# Patient Record
Sex: Male | Born: 2000 | Race: White | Hispanic: No | Marital: Single | Smoking: Never smoker
Health system: Southern US, Community
[De-identification: ages and names within clinical notes are randomized; demographics above are authoritative.]

## PROBLEM LIST (undated history)

## (undated) DIAGNOSIS — F909 Attention-deficit hyperactivity disorder, unspecified type: Secondary | ICD-10-CM

## (undated) DIAGNOSIS — Z87442 Personal history of urinary calculi: Secondary | ICD-10-CM

## (undated) HISTORY — PX: NO PAST SURGERIES: SHX2092

---
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
## 2020-06-24 IMAGING — CR DG THORACOLUMBAR SPINE 2V
2 series · 2 of 2 positions shown · non-contrast
Comparison: None.

CLINICAL DATA: Chronic back pain

EXAM:
THORACOLUMBAR SPINE 1V

[w thoraco-lumbar junction ap]
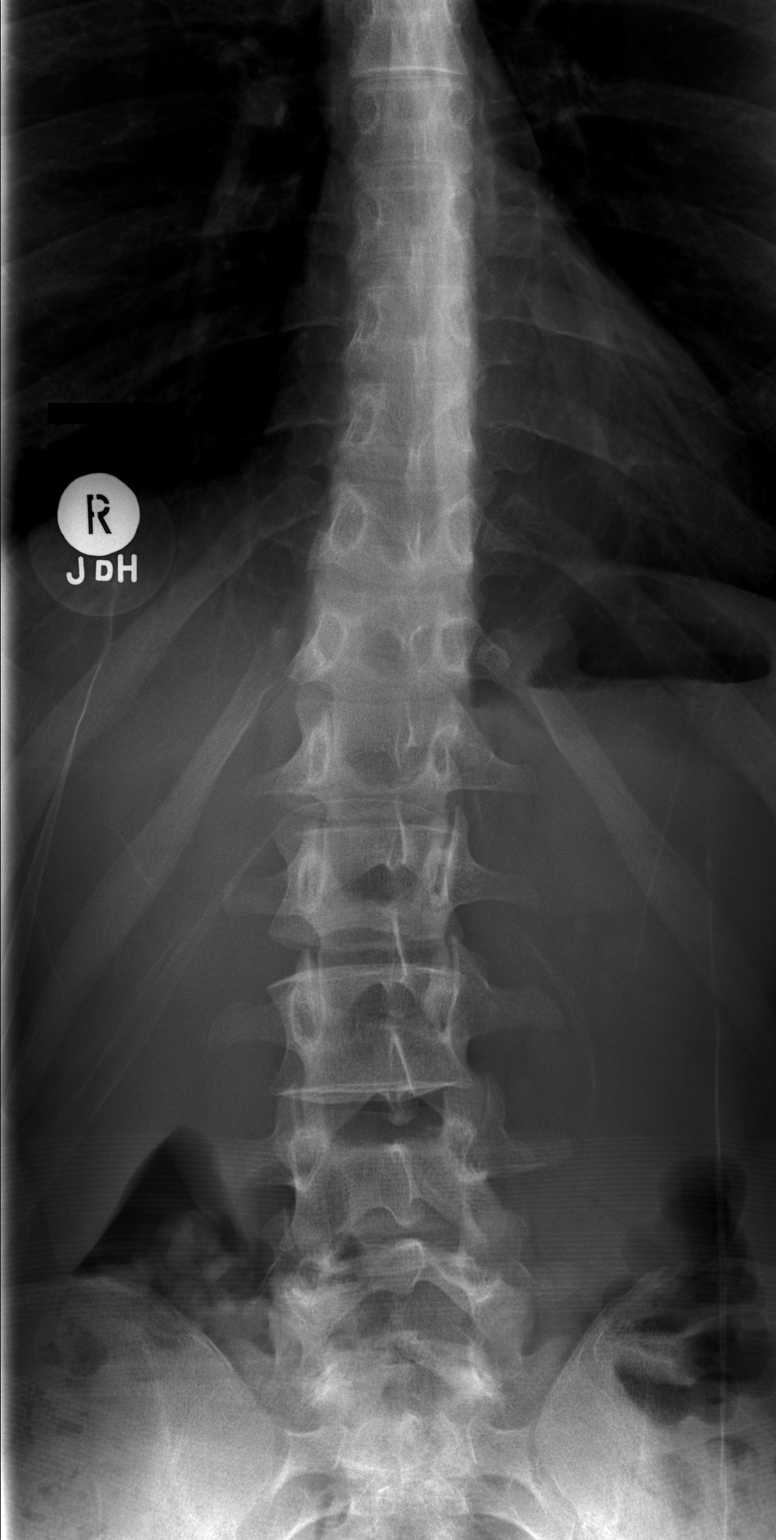

[w thoraco-lumbar junction lat]
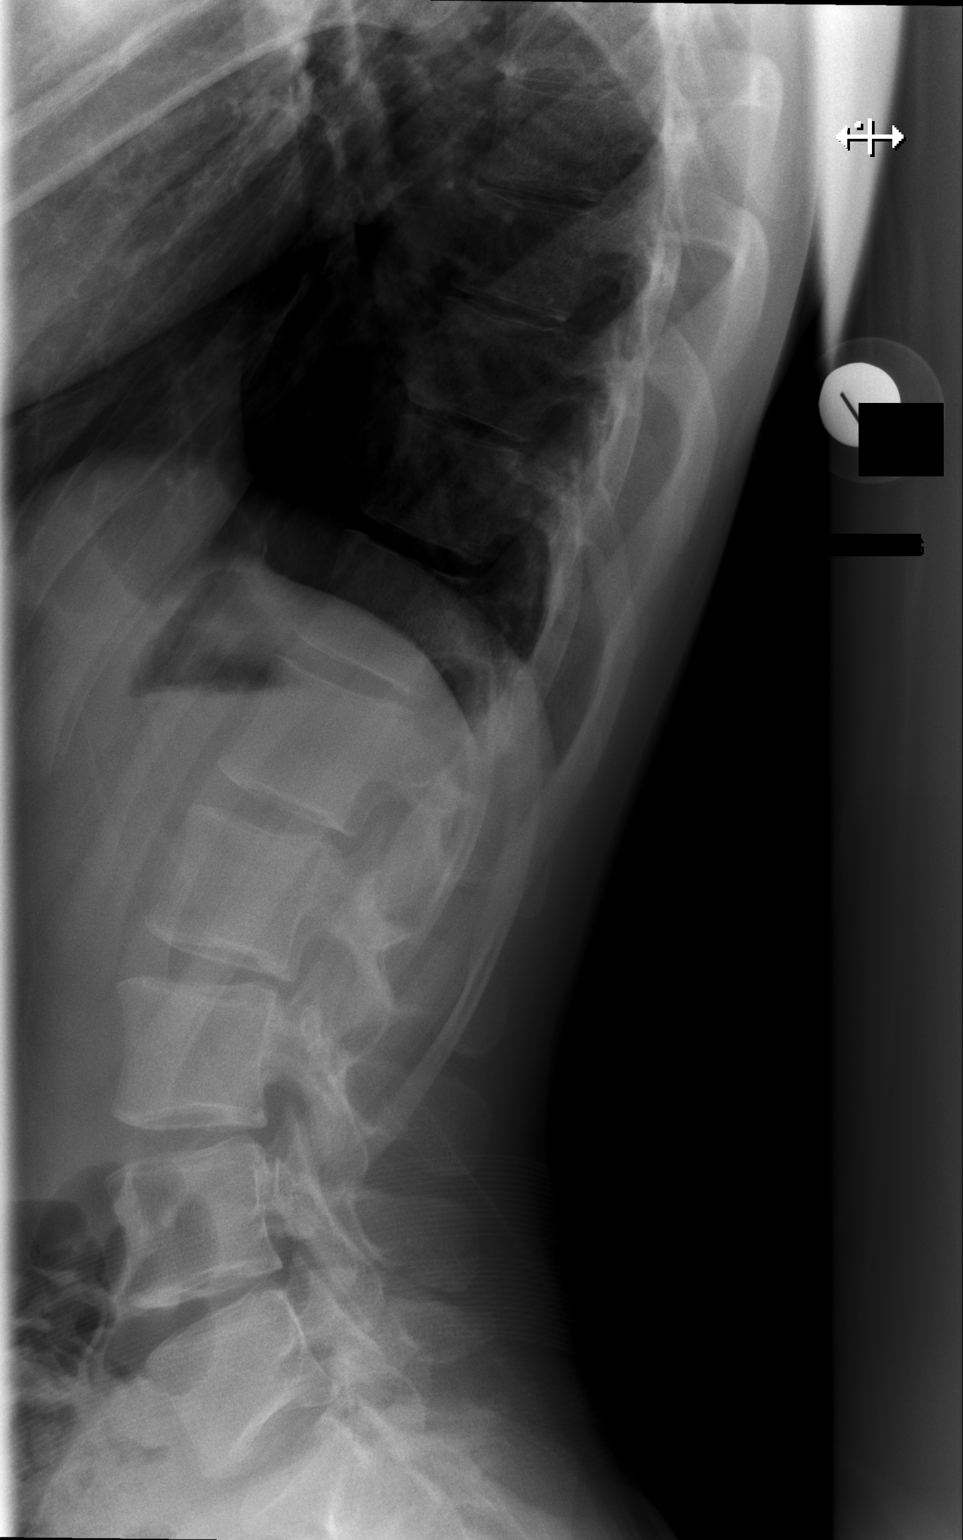

[2 of 2 positions shown; findings below may reference images not displayed]

FINDINGS: Minimal scoliosis, apex right. Vertebral body heights and disc
spaces are normal.
IMPRESSION: Minimal scoliosis.  No acute osseous abnormality

## 2020-07-23 DIAGNOSIS — J069 Acute upper respiratory infection, unspecified: Secondary | ICD-10-CM | POA: Diagnosis not present

## 2020-12-05 DIAGNOSIS — Z20822 Contact with and (suspected) exposure to covid-19: Secondary | ICD-10-CM | POA: Diagnosis not present

## 2021-03-14 DIAGNOSIS — M9901 Segmental and somatic dysfunction of cervical region: Secondary | ICD-10-CM | POA: Diagnosis not present

## 2021-03-14 DIAGNOSIS — M4155 Other secondary scoliosis, thoracolumbar region: Secondary | ICD-10-CM | POA: Diagnosis not present

## 2021-03-14 DIAGNOSIS — M9903 Segmental and somatic dysfunction of lumbar region: Secondary | ICD-10-CM | POA: Diagnosis not present

## 2021-03-14 DIAGNOSIS — M9905 Segmental and somatic dysfunction of pelvic region: Secondary | ICD-10-CM | POA: Diagnosis not present

## 2021-03-18 DIAGNOSIS — M9901 Segmental and somatic dysfunction of cervical region: Secondary | ICD-10-CM | POA: Diagnosis not present

## 2021-03-18 DIAGNOSIS — M4155 Other secondary scoliosis, thoracolumbar region: Secondary | ICD-10-CM | POA: Diagnosis not present

## 2021-03-18 DIAGNOSIS — M9905 Segmental and somatic dysfunction of pelvic region: Secondary | ICD-10-CM | POA: Diagnosis not present

## 2021-03-18 DIAGNOSIS — M9903 Segmental and somatic dysfunction of lumbar region: Secondary | ICD-10-CM | POA: Diagnosis not present

## 2022-04-02 DIAGNOSIS — U071 COVID-19: Secondary | ICD-10-CM | POA: Diagnosis not present

## 2023-04-23 DIAGNOSIS — M222X2 Patellofemoral disorders, left knee: Secondary | ICD-10-CM | POA: Diagnosis not present

## 2023-05-06 DIAGNOSIS — J069 Acute upper respiratory infection, unspecified: Secondary | ICD-10-CM | POA: Diagnosis not present

## 2023-05-06 DIAGNOSIS — R051 Acute cough: Secondary | ICD-10-CM | POA: Diagnosis not present

## 2023-05-06 DIAGNOSIS — Z03818 Encounter for observation for suspected exposure to other biological agents ruled out: Secondary | ICD-10-CM | POA: Diagnosis not present

## 2023-05-20 ENCOUNTER — Emergency Department (HOSPITAL_BASED_OUTPATIENT_CLINIC_OR_DEPARTMENT_OTHER): Payer: BC Managed Care – PPO

## 2023-05-20 ENCOUNTER — Encounter (HOSPITAL_BASED_OUTPATIENT_CLINIC_OR_DEPARTMENT_OTHER): Payer: Self-pay

## 2023-05-20 ENCOUNTER — Emergency Department (HOSPITAL_BASED_OUTPATIENT_CLINIC_OR_DEPARTMENT_OTHER)
Admission: EM | Admit: 2023-05-20 | Discharge: 2023-05-20 | Disposition: A | Discharge status: Home / Self Care | Payer: BC Managed Care – PPO | Attending: Emergency Medicine | Admitting: Emergency Medicine

## 2023-05-20 DIAGNOSIS — R109 Unspecified abdominal pain: Secondary | ICD-10-CM | POA: Diagnosis not present

## 2023-05-20 DIAGNOSIS — N200 Calculus of kidney: Secondary | ICD-10-CM | POA: Diagnosis not present

## 2023-05-20 DIAGNOSIS — N202 Calculus of kidney with calculus of ureter: Secondary | ICD-10-CM | POA: Diagnosis not present

## 2023-05-20 DIAGNOSIS — K429 Umbilical hernia without obstruction or gangrene: Secondary | ICD-10-CM | POA: Diagnosis not present

## 2023-05-20 DIAGNOSIS — K529 Noninfective gastroenteritis and colitis, unspecified: Secondary | ICD-10-CM | POA: Insufficient documentation

## 2023-05-20 LAB — URINALYSIS, ROUTINE W REFLEX MICROSCOPIC
Bilirubin Urine: NEGATIVE
Glucose, UA: NEGATIVE mg/dL
Leukocytes,Ua: NEGATIVE
Nitrite: NEGATIVE
Protein, ur: 30 mg/dL — AB
RBC / HPF: >50 RBC/hpf (ref 0–5)
Specific Gravity, Urine: 1.022 (ref 1.005–1.030)
pH: 5.5 (ref 5.0–8.0)

## 2023-05-20 LAB — BASIC METABOLIC PANEL
Anion gap: 7 (ref 5–15)
BUN: 7 mg/dL (ref 6–20)
CO2: 27 mmol/L (ref 22–32)
Calcium: 9.2 mg/dL (ref 8.9–10.3)
Chloride: 107 mmol/L (ref 98–111)
Creatinine, Ser: 0.85 mg/dL (ref 0.61–1.24)
GFR, Estimated: >60 mL/min (ref >60)
Glucose, Bld: 94 mg/dL (ref 70–99)
Potassium: 3.8 mmol/L (ref 3.5–5.1)
Sodium: 141 mmol/L (ref 135–145)

## 2023-05-20 MED ORDER — TAMSULOSIN HCL 0.4 MG PO CAPS
0.4000 mg | ORAL_CAPSULE | ORAL | Status: AC
  Administered 2023-05-20: 0.4 mg via ORAL
  Filled 2023-05-20: qty 1

## 2023-05-20 MED ORDER — AMOXICILLIN-POT CLAVULANATE 875-125 MG PO TABS
1.0000 | ORAL_TABLET | Freq: Once | ORAL | Status: AC
  Administered 2023-05-20: 1 via ORAL
  Filled 2023-05-20: qty 1

## 2023-05-20 MED ORDER — DICLOFENAC SODIUM ER 100 MG PO TB24: 100.0000 mg | ORAL_TABLET | Freq: Every day | ORAL | 0 refills | Status: DC

## 2023-05-20 MED ORDER — ONDANSETRON HCL 8 MG PO TABS: 8.0000 mg | ORAL_TABLET | Freq: Three times a day (TID) | ORAL | 0 refills | Status: AC | PRN

## 2023-05-20 MED ORDER — ONDANSETRON HCL 4 MG/2ML IJ SOLN
4.0000 mg | Freq: Once | INTRAMUSCULAR | Status: AC
  Administered 2023-05-20: 4 mg via INTRAVENOUS
  Filled 2023-05-20: qty 2

## 2023-05-20 MED ORDER — TAMSULOSIN HCL 0.4 MG PO CAPS: 0.4000 mg | ORAL_CAPSULE | Freq: Every day | ORAL | 0 refills | Status: AC

## 2023-05-20 MED ORDER — AMOXICILLIN-POT CLAVULANATE 875-125 MG PO TABS: 1.0000 | ORAL_TABLET | Freq: Two times a day (BID) | ORAL | 0 refills | Status: AC

## 2023-05-20 MED ORDER — MAGNESIUM SULFATE 2 GM/50ML IV SOLN
2.0000 g | Freq: Once | INTRAVENOUS | Status: AC
  Administered 2023-05-20: 2 g via INTRAVENOUS
  Filled 2023-05-20: qty 50

## 2023-05-20 MED ORDER — KETOROLAC TROMETHAMINE 30 MG/ML IJ SOLN
30.0000 mg | Freq: Once | INTRAMUSCULAR | Status: AC
  Administered 2023-05-20: 30 mg via INTRAVENOUS
  Filled 2023-05-20: qty 1

## 2023-05-20 MED ORDER — ONDANSETRON HCL 8 MG PO TABS: 8.0000 mg | ORAL_TABLET | Freq: Three times a day (TID) | ORAL | 0 refills | Status: DC | PRN

## 2023-05-20 MED ORDER — HYDROCODONE-ACETAMINOPHEN 5-325 MG PO TABS: 1.0000 | ORAL_TABLET | Freq: Four times a day (QID) | ORAL | 0 refills | Status: AC | PRN

## 2023-05-20 NOTE — ED Provider Notes (Signed)
[place] EMERGENCY DEPARTMENT AT [facility] Provider Note   CSN: [id number] Arrival date & time: 05/20/23  0342     History  Chief Complaint  Patient presents with   Flank Pain    Right side    Keith Park is a 22 y.o. male.  The history is provided by the patient and a parent.  Flank Pain This is a new problem. The current episode started 3 to 5 hours ago. The problem occurs constantly. The problem has not changed since onset.Pertinent negatives include no chest pain, no headaches and no shortness of breath. Nothing aggravates the symptoms. Nothing relieves the symptoms. The treatment provided no relief.  Patient with R flank pain and bloody urine as well as nausea.     History reviewed. No pertinent past medical history.   Home Medications Prior to Admission medications   Medication Sig Start Date End Date Taking? Authorizing Provider  amoxicillin-clavulanate (AUGMENTIN) 875-125 MG tablet Take 1 tablet by mouth every 12 (twelve) hours. 05/20/23  Yes Rondarius Kadrmas, MD  Diclofenac Sodium CR 100 MG 24 hr tablet Take 1 tablet (100 mg total) by mouth daily. 05/20/23  Yes Lahna Nath, MD  tamsulosin (FLOMAX) 0.4 MG CAPS capsule Take 1 capsule (0.4 mg total) by mouth daily. 05/20/23  Yes Merlon Alcorta, MD  ondansetron (ZOFRAN) 8 MG tablet Take 1 tablet (8 mg total) by mouth every 8 (eight) hours as needed for nausea. 05/20/23   Britiany Silbernagel, MD      Allergies    Patient has no known allergies.    Review of Systems   Review of Systems  Constitutional:  Negative for fever.  Respiratory:  Negative for shortness of breath.   Cardiovascular:  Negative for chest pain.  Gastrointestinal:  Positive for nausea.  Genitourinary:  Positive for flank pain and hematuria.  Neurological:  Negative for headaches.  All other systems reviewed and are negative.   Physical Exam Updated Vital Signs BP 128/87   Pulse 60   Ht 5\' 10"  (1.778 m)   Wt 59 kg   SpO2 100%   BMI  18.65 kg/m  Physical Exam Vitals and nursing note reviewed. Exam conducted with a chaperone present.  Constitutional:      General: He is not in acute distress.    Appearance: Normal appearance. He is well-developed. He is not diaphoretic.  HENT:     Head: Normocephalic and atraumatic.     Nose: Nose normal.  Eyes:     Conjunctiva/sclera: Conjunctivae normal.     Pupils: Pupils are equal, round, and reactive to light.  Cardiovascular:     Rate and Rhythm: Normal rate and regular rhythm.     Pulses: Normal pulses.     Heart sounds: Normal heart sounds.  Pulmonary:     Effort: Pulmonary effort is normal.     Breath sounds: Normal breath sounds. No wheezing or rales.  Abdominal:     General: Bowel sounds are normal.     Palpations: Abdomen is soft.     Tenderness: There is no abdominal tenderness. There is no guarding or rebound.  Musculoskeletal:        General: Normal range of motion.     Cervical back: Normal range of motion and neck supple.  Skin:    General: Skin is warm and dry.     Capillary Refill: Capillary refill takes less than 2 seconds.  Neurological:     General: No focal deficit present.  Mental Status: He is alert and oriented to person, place, and time.     Deep Tendon Reflexes: Reflexes normal.  Psychiatric:        Mood and Affect: Mood normal.        Behavior: Behavior normal.     ED Results / Procedures / Treatments   Labs (all labs ordered are listed, but only abnormal results are displayed) Results for orders placed or performed during the hospital encounter of 05/20/23  Urinalysis, Routine w reflex microscopic -Urine, Clean Catch  Result Value Ref Range   Color, Urine YELLOW YELLOW   APPearance CLOUDY (A) CLEAR   Specific Gravity, Urine 1.022 1.005 - 1.030   pH 5.5 5.0 - 8.0   Glucose, UA NEGATIVE NEGATIVE mg/dL   Hgb urine dipstick LARGE (A) NEGATIVE   Bilirubin Urine NEGATIVE NEGATIVE   Ketones, ur TRACE (A) NEGATIVE mg/dL   Protein, ur  30 (A) NEGATIVE mg/dL   Nitrite NEGATIVE NEGATIVE   Leukocytes,Ua NEGATIVE NEGATIVE   RBC / HPF >50 0 - 5 RBC/hpf   WBC, UA 21-50 0 - 5 WBC/hpf   Bacteria, UA FEW (A) NONE SEEN   Squamous Epithelial / HPF 0-5 0 - 5 /HPF   Mucus PRESENT   Basic metabolic panel  Result Value Ref Range   Sodium 141 135 - 145 mmol/L   Potassium 3.8 3.5 - 5.1 mmol/L   Chloride 107 98 - 111 mmol/L   CO2 27 22 - 32 mmol/L   Glucose, Bld 94 70 - 99 mg/dL   BUN 7 6 - 20 mg/dL   Creatinine, Ser 0.98 0.61 - 1.24 mg/dL   Calcium 9.2 8.9 - 11.9 mg/dL   GFR, Estimated >14 >78 mL/min   Anion gap 7 5 - 15   CT Renal Stone Study  Result Date: 05/20/2023 CLINICAL DATA:  Right flank pain with blood in the urine and painful urination. EXAM: CT ABDOMEN AND PELVIS WITHOUT CONTRAST TECHNIQUE: Multidetector CT imaging of the abdomen and pelvis was performed following the standard protocol without IV contrast. RADIATION DOSE REDUCTION: This exam was performed according to the departmental dose-optimization program which includes automated exposure control, adjustment of the mA and/or kV according to patient size and/or use of iterative reconstruction technique. COMPARISON:  None Available. FINDINGS: Lower chest: There are scattered linear scar-like opacities in the left-greater-than-right lower lobes. Lung bases are clear of infiltrates. The cardiac size is normal. Hepatobiliary: The liver is 18 cm length demonstrating normal noncontrast attenuation and is homogeneous without a visible focal mass. Evaluation for mass enhancement is less accurate without contrast. Gallbladder and bile ducts are unremarkable. Pancreas: Unremarkable without contrast. Spleen: Unremarkable without contrast.  No splenomegaly. Adrenals/Urinary Tract: There is no adrenal mass. There is no contour deforming abnormality of either kidney. The unenhanced renal cortex is homogeneous. Both kidneys demonstrate faint medullary nephrocalcinosis. There are occasional  punctate nonobstructive caliceal stones in both kidneys. There is no hydronephrosis bilaterally. On the right there is a 3 mm distal ureteral stone 2 cm-2.5 cm proximal to the UVJ, just above the level of the ischial spines. Both ureters are otherwise clear. The bladder is unremarkable for the degree of distention. Stomach/Bowel: No dilatation or wall thickening of the stomach and small bowel. The appendix is located along the right pelvic side wall in between the internal and external iliac vasculature, and is unremarkable. There is mild wall thickening versus nondistention in the ascending and proximal transverse colon. Underlying colitis is not strictly excluded. Rest of  the colon wall is unremarkable. Vascular/Lymphatic: No significant vascular findings are present. No enlarged abdominal or pelvic lymph nodes. Reproductive: Prostate is unremarkable and both testicles are in the scrotal sac. Other: Small umbilical fat hernia. There are no incarcerated hernias. There is no free hemorrhage, free air, or free fluid. Musculoskeletal: No acute or significant osseous findings. IMPRESSION: 1. 3 mm distal right ureteral stone 2 cm-2.5 cm proximal to the UVJ, without hydronephrosis. 2. 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 3. Ascending and proximal transverse colitis versus nondistention. 4. Small umbilical fat hernia. Electronically Signed   By: Almira Bar M.D.   On: 05/20/2023 05:12     Radiology CT Renal Stone Study  Result Date: 05/20/2023 CLINICAL DATA:  Right flank pain with blood in the urine and painful urination. EXAM: CT ABDOMEN AND PELVIS WITHOUT CONTRAST TECHNIQUE: Multidetector CT imaging of the abdomen and pelvis was performed following the standard protocol without IV contrast. RADIATION DOSE REDUCTION: This exam was performed according to the departmental dose-optimization program which includes automated exposure control,  adjustment of the mA and/or kV according to patient size and/or use of iterative reconstruction technique. COMPARISON:  None Available. FINDINGS: Lower chest: There are scattered linear scar-like opacities in the left-greater-than-right lower lobes. Lung bases are clear of infiltrates. The cardiac size is normal. Hepatobiliary: The liver is 18 cm length demonstrating normal noncontrast attenuation and is homogeneous without a visible focal mass. Evaluation for mass enhancement is less accurate without contrast. Gallbladder and bile ducts are unremarkable. Pancreas: Unremarkable without contrast. Spleen: Unremarkable without contrast.  No splenomegaly. Adrenals/Urinary Tract: There is no adrenal mass. There is no contour deforming abnormality of either kidney. The unenhanced renal cortex is homogeneous. Both kidneys demonstrate faint medullary nephrocalcinosis. There are occasional punctate nonobstructive caliceal stones in both kidneys. There is no hydronephrosis bilaterally. On the right there is a 3 mm distal ureteral stone 2 cm-2.5 cm proximal to the UVJ, just above the level of the ischial spines. Both ureters are otherwise clear. The bladder is unremarkable for the degree of distention. Stomach/Bowel: No dilatation or wall thickening of the stomach and small bowel. The appendix is located along the right pelvic side wall in between the internal and external iliac vasculature, and is unremarkable. There is mild wall thickening versus nondistention in the ascending and proximal transverse colon. Underlying colitis is not strictly excluded. Rest of the colon wall is unremarkable. Vascular/Lymphatic: No significant vascular findings are present. No enlarged abdominal or pelvic lymph nodes. Reproductive: Prostate is unremarkable and both testicles are in the scrotal sac. Other: Small umbilical fat hernia. There are no incarcerated hernias. There is no free hemorrhage, free air, or free fluid. Musculoskeletal: No  acute or significant osseous findings. IMPRESSION: 1. 3 mm distal right ureteral stone 2 cm-2.5 cm proximal to the UVJ, without hydronephrosis. 2. 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 3. Ascending and proximal transverse colitis versus nondistention. 4. Small umbilical fat hernia. Electronically Signed   By: Almira Bar M.D.   On: 05/20/2023 05:12    Procedures Procedures    Medications Ordered in ED Medications  ketorolac (TORADOL) 30 MG/ML injection 30 mg (30 mg Intravenous Given 05/20/23 0454)  magnesium sulfate IVPB 2 g 50 mL (0 g Intravenous Stopped 05/20/23 0529)  tamsulosin (FLOMAX) capsule 0.4 mg (0.4 mg Oral Given 05/20/23 0528)  ondansetron (ZOFRAN) injection 4 mg (4 mg Intravenous Given 05/20/23 0540)  amoxicillin-clavulanate (AUGMENTIN) 875-125 MG per tablet 1 tablet (  1 tablet Oral Given 05/20/23 0540)    ED Course/ Medical Decision Making/ A&P                             Medical Decision Making Patient with right flank pain and nausea   Amount and/or Complexity of Data Reviewed Independent Historian: parent    Details: See above  External Data Reviewed: notes.    Details: Previous notes reviewed  Labs: ordered.    Details: Urine consistent with hematuria.  Sodium normal 141, normal potassium 3.8, normal creatinine  Radiology: ordered and independent interpretation performed.    Details: Distal R kidney stone by me  Risk Prescription drug management. Risk Details: Kidney stone and colitis.  Strainer provided. Work note provided.  Instructed cannot drive while taking narcotics.  Will refer to urology for kidney stone and GI for follow up of colitis as patient will need colonoscopy.  Will start augmentin.  PO challenged successfully.   Stable for discharge.  Strict return.  All questions answered to patient's and family satisfaction    Final Clinical Impression(s) / ED Diagnoses Final diagnoses:   Colitis  Kidney stone   Return for intractable cough, coughing up blood, fevers > 100.4 unrelieved by medication, shortness of breath, intractable vomiting, chest pain, shortness of breath, weakness, numbness, changes in speech, facial asymmetry, abdominal pain, passing out, Inability to tolerate liquids or food, cough, altered mental status or any concerns. No signs of systemic illness or infection. The patient is nontoxic-appearing on exam and vital signs are within normal limits.  I have reviewed the triage vital signs and the nursing notes. Pertinent labs & imaging results that were available during my care of the patient were reviewed by me and considered in my medical decision making (see chart for details). After history, exam, and medical workup I feel the patient has been appropriately medically screened and is safe for discharge home. Pertinent diagnoses were discussed with the patient. Patient was given return precautions.  Rx / DC Orders ED Discharge Orders          Ordered    amoxicillin-clavulanate (AUGMENTIN) 875-125 MG tablet  Every 12 hours        05/20/23 0534    ondansetron (ZOFRAN) 8 MG tablet  Every 8 hours PRN,   Status:  Discontinued        05/20/23 0534    tamsulosin (FLOMAX) 0.4 MG CAPS capsule  Daily        05/20/23 0534    Diclofenac Sodium CR 100 MG 24 hr tablet  Daily        05/20/23 0534    ondansetron (ZOFRAN) 8 MG tablet  Every 8 hours PRN        05/20/23 0535              Kendrah Lovern, MD 05/20/23 3329

## 2023-05-20 NOTE — ED Triage Notes (Signed)
Patient states he woke up an hour ago with right side flank pain. Patient states there is blood in his urine, pain with urination.

## 2023-05-24 ENCOUNTER — Other Ambulatory Visit: Payer: Self-pay

## 2023-05-24 ENCOUNTER — Emergency Department (HOSPITAL_BASED_OUTPATIENT_CLINIC_OR_DEPARTMENT_OTHER): Payer: BC Managed Care – PPO

## 2023-05-24 ENCOUNTER — Emergency Department (HOSPITAL_BASED_OUTPATIENT_CLINIC_OR_DEPARTMENT_OTHER)
Admission: EM | Admit: 2023-05-24 | Discharge: 2023-05-24 | Disposition: A | Discharge status: Home / Self Care | Payer: BC Managed Care – PPO | Attending: Emergency Medicine | Admitting: Emergency Medicine

## 2023-05-24 DIAGNOSIS — R1030 Lower abdominal pain, unspecified: Secondary | ICD-10-CM | POA: Diagnosis not present

## 2023-05-24 DIAGNOSIS — R109 Unspecified abdominal pain: Secondary | ICD-10-CM | POA: Diagnosis not present

## 2023-05-24 DIAGNOSIS — N201 Calculus of ureter: Secondary | ICD-10-CM | POA: Diagnosis not present

## 2023-05-24 LAB — URINALYSIS, ROUTINE W REFLEX MICROSCOPIC
Bacteria, UA: NONE SEEN
Bilirubin Urine: NEGATIVE
Glucose, UA: NEGATIVE mg/dL
Ketones, ur: NEGATIVE mg/dL
Leukocytes,Ua: NEGATIVE
Nitrite: NEGATIVE
Protein, ur: NEGATIVE mg/dL
Specific Gravity, Urine: 1.01 (ref 1.005–1.030)
pH: 5.5 (ref 5.0–8.0)

## 2023-05-24 LAB — CBC
HCT: 43.8 % (ref 39.0–52.0)
Hemoglobin: 14.7 g/dL (ref 13.0–17.0)
MCH: 27.7 pg (ref 26.0–34.0)
MCHC: 33.6 g/dL (ref 30.0–36.0)
MCV: 82.5 fL (ref 80.0–100.0)
Platelets: 240 10*3/uL (ref 150–400)
RBC: 5.31 MIL/uL (ref 4.22–5.81)
RDW: 13.5 % (ref 11.5–15.5)
WBC: 7.1 10*3/uL (ref 4.0–10.5)
nRBC: 0 % (ref 0.0–0.2)

## 2023-05-24 LAB — COMPREHENSIVE METABOLIC PANEL
ALT: 13 U/L (ref 0–44)
AST: 21 U/L (ref 15–41)
Albumin: 4.6 g/dL (ref 3.5–5.0)
Alkaline Phosphatase: 48 U/L (ref 38–126)
Anion gap: 8 (ref 5–15)
BUN: 13 mg/dL (ref 6–20)
CO2: 26 mmol/L (ref 22–32)
Calcium: 9.5 mg/dL (ref 8.9–10.3)
Chloride: 103 mmol/L (ref 98–111)
Creatinine, Ser: 1.07 mg/dL (ref 0.61–1.24)
GFR, Estimated: >60 mL/min (ref >60)
Glucose, Bld: 124 mg/dL — ABNORMAL HIGH (ref 70–99)
Potassium: 3.8 mmol/L (ref 3.5–5.1)
Sodium: 137 mmol/L (ref 135–145)
Total Bilirubin: 0.7 mg/dL (ref 0.3–1.2)
Total Protein: 7.4 g/dL (ref 6.5–8.1)

## 2023-05-24 LAB — LIPASE, BLOOD: Lipase: 18 U/L (ref 11–51)

## 2023-05-24 MED ORDER — KETOROLAC TROMETHAMINE 15 MG/ML IJ SOLN
15.0000 mg | Freq: Once | INTRAMUSCULAR | Status: AC
  Administered 2023-05-24: 15 mg via INTRAVENOUS
  Filled 2023-05-24: qty 1

## 2023-05-24 MED ORDER — KETOROLAC TROMETHAMINE 10 MG PO TABS: 10.0000 mg | ORAL_TABLET | Freq: Four times a day (QID) | ORAL | 0 refills | Status: AC | PRN

## 2023-05-24 MED ORDER — ONDANSETRON HCL 4 MG/2ML IJ SOLN
4.0000 mg | Freq: Once | INTRAMUSCULAR | Status: AC
  Administered 2023-05-24: 4 mg via INTRAVENOUS
  Filled 2023-05-24: qty 2

## 2023-05-24 NOTE — ED Notes (Signed)
Pt verbalized understanding of d/c instructions, meds, and followup care. Denies questions. VSS, no distress noted. Steady gait to exit with all belongings. Strainer given for stone collection.

## 2023-05-24 NOTE — ED Provider Notes (Signed)
[place] EMERGENCY DEPARTMENT AT [facility] Provider Note   CSN: [id number] Arrival date & time: 05/24/23  1334     History  Chief Complaint  Patient presents with   Flank Pain    Keith Park is a 22 y.o. male.   Flank Pain   22 year old male presents emergency department with complaints of right-sided flank pain.  Patient states he was recently seen in the emergency department on 05/20/2023 and diagnosed with right-sided kidney stone as well as colitis.  Patient states he has been taking Flomax as prescribed as well as pain medication and feels as if he is passed at least 1 stone as he noticed small object and strainer.  States that around 2 AM this morning, noted acute onset right-sided abdominal pain that he describes as more severe than when he initially came to the emergency department.  Reports feelings of nausea with no emesis.  States that pain has been refractory to medicines prescribed.  Has upcoming appoint with urology the second or third week of July and was unable to get an acute visit.  Denies any fever, chills, change in bowel habits, chest pain, shortness of breath.  Does report some burning type sensation after he urinates as well as some darker colored urine that has since been appearing more clear since prior ED visit.  No significant pertinent past medical history.  Home Medications Prior to Admission medications   Medication Sig Start Date End Date Taking? Authorizing Provider  ketorolac (TORADOL) 10 MG tablet Take 1 tablet (10 mg total) by mouth every 6 (six) hours as needed. 05/24/23  Yes Sherian Maroon A, PA  amoxicillin-clavulanate (AUGMENTIN) 875-125 MG tablet Take 1 tablet by mouth every 12 (twelve) hours. 05/20/23   Palumbo, April, MD  HYDROcodone-acetaminophen (NORCO/VICODIN) 5-325 MG tablet Take 1 tablet by mouth every 6 (six) hours as needed. 05/20/23   Palumbo, April, MD  ondansetron (ZOFRAN) 8 MG tablet Take 1 tablet (8 mg total) by mouth  every 8 (eight) hours as needed for nausea. 05/20/23   Palumbo, April, MD  tamsulosin (FLOMAX) 0.4 MG CAPS capsule Take 1 capsule (0.4 mg total) by mouth daily. 05/20/23   Palumbo, April, MD      Allergies    Patient has no known allergies.    Review of Systems   Review of Systems  Genitourinary:  Positive for flank pain.  All other systems reviewed and are negative.   Physical Exam Updated Vital Signs BP 113/71   Pulse 62   Temp 97.7 F (36.5 C) (Oral)   Resp 16   Ht 5\' 10"  (1.778 m)   Wt 59 kg   SpO2 100%   BMI 18.66 kg/m  Physical Exam Vitals and nursing note reviewed.  Constitutional:      General: He is not in acute distress.    Appearance: He is well-developed.  HENT:     Head: Normocephalic and atraumatic.  Eyes:     Conjunctiva/sclera: Conjunctivae normal.  Cardiovascular:     Rate and Rhythm: Normal rate and regular rhythm.     Heart sounds: No murmur heard. Pulmonary:     Effort: Pulmonary effort is normal. No respiratory distress.     Breath sounds: Normal breath sounds.  Abdominal:     Palpations: Abdomen is soft.     Tenderness: There is abdominal tenderness. There is no right CVA tenderness or left CVA tenderness.     Comments: Right upper quadrant tenderness to palpation.  Right lower quadrant  tenderness to palpation.  Musculoskeletal:        General: No swelling.     Cervical back: Neck supple.     Right lower leg: No edema.     Left lower leg: No edema.  Skin:    General: Skin is warm and dry.     Capillary Refill: Capillary refill takes less than 2 seconds.  Neurological:     Mental Status: He is alert.  Psychiatric:        Mood and Affect: Mood normal.     ED Results / Procedures / Treatments   Labs (all labs ordered are listed, but only abnormal results are displayed) Labs Reviewed  COMPREHENSIVE METABOLIC PANEL - Abnormal; Notable for the following components:      Result Value   Glucose, Bld 124 (*)    All other components within  normal limits  URINALYSIS, ROUTINE W REFLEX MICROSCOPIC - Abnormal; Notable for the following components:   Hgb urine dipstick MODERATE (*)    All other components within normal limits  LIPASE, BLOOD  CBC    EKG None  Radiology CT Renal Stone Study  Result Date: 05/24/2023 CLINICAL DATA:  Abdominal pain.  Right EXAM: CT ABDOMEN AND PELVIS WITHOUT CONTRAST TECHNIQUE: Multidetector CT imaging of the abdomen and pelvis was performed following the standard protocol without IV contrast. RADIATION DOSE REDUCTION: This exam was performed according to the departmental dose-optimization program which includes automated exposure control, adjustment of the mA and/or kV according to patient size and/or use of iterative reconstruction technique. COMPARISON:  Flank pain renal stone CT 05/20/2023 FINDINGS: Lower chest: Lung bases are clear.  No pleural effusion. Hepatobiliary: The very top of the liver is clipped off the edge of the imaging field. What is seen of the liver has a preserved appearance of the parenchyma. The gallbladder is nondilated. Pancreas: Unremarkable. No pancreatic ductal dilatation or surrounding inflammatory changes. Spleen: Normal in size without focal abnormality. Adrenals/Urinary Tract: The adrenal glands are preserved. Punctate areas of density in the renal pyramids. Slight ectasia of the right ureter. The previous 3 mm stone in the right ureter is now further distal, within 1 cm of the UVJ. No additional ureteral stones. Preserved contours of the urinary bladder. Stomach/Bowel: No oral contrast. The stomach and small bowel are nondilated. Moderate colonic stool. Large bowel is nondilated. Normal appendix. Vascular/Lymphatic: No significant vascular findings are present. No enlarged abdominal or pelvic lymph nodes. Reproductive: Prostate is unremarkable. Other: No abdominal wall hernia or abnormality. No abdominopelvic ascites. Musculoskeletal: No acute or significant osseous findings.  IMPRESSION: Distal right 3 mm ureteral stone is now further distal in the ureter now 1 cm proximal to the UVJ proximally. Slight increase in the mild right renal collecting system dilatation. Electronically Signed   By: Karen Kays M.D.   On: 05/24/2023 17:42    Procedures Procedures    Medications Ordered in ED Medications  ketorolac (TORADOL) 15 MG/ML injection 15 mg (15 mg Intravenous Given 05/24/23 1536)  ondansetron (ZOFRAN) injection 4 mg (4 mg Intravenous Given 05/24/23 1538)    ED Course/ Medical Decision Making/ A&P                             Medical Decision Making Amount and/or Complexity of Data Reviewed Labs: ordered. Radiology: ordered.  Risk Prescription drug management.   This patient presents to the ED for concern of abdominal pain, this involves an extensive number of treatment  options, and is a complaint that carries with it a high risk of complications and morbidity.  The differential diagnosis includes pyelonephritis, nephrolithiasis, cystitis, gastritis, PUD, cholecystitis, hepatitis, CBD pathology, SBO/LBO, diverticulitis, appendicitis, colitis   Co morbidities that complicate the patient evaluation  See HPI   Additional history obtained:  Additional history obtained from EMR External records from outside source obtained and reviewed including hospital records   Lab Tests:  I Ordered, and personally interpreted labs.  The pertinent results include: No leukocytosis.  No evidence of anemia.  Platelets within range.  No electrolyte abnormalities.  No transaminitis.  No renal dysfunction.  UA significant for 11-20 RBCs with moderate hemoglobin otherwise unremarkable.  Lipase within normal limits.   Imaging Studies ordered:  I ordered imaging studies including CT renal stone study I independently visualized and interpreted imaging which showed 3 mm right-sided kidney stone 1 cm from UVJ I agree with the radiologist interpretation   Cardiac  Monitoring: / EKG:  The patient was maintained on a cardiac monitor.  I personally viewed and interpreted the cardiac monitored which showed an underlying rhythm of: Sinus rhythm   Consultations Obtained:  N/a   Problem List / ED Course / Critical interventions / Medication management  Right flank pain, ureterolithiasis I ordered medication including Toradol, Zofran   Reevaluation of the patient after these medicines showed that the patient improved I have reviewed the patients home medicines and have made adjustments as needed   Social Determinants of Health:  Denies tobacco, illicit drug use   Test / Admission - Considered:  Right flank pain, ureterolithiasis Vitals signs within normal range and stable throughout visit. Laboratory/imaging studies significant for: See above 22 year old male presents emergency department with continued right-sided flank pain.  Patient's workup today overall reassuring with evidence of still right-sided 3 mm kidney stone now 1 cm from the UVJ.  No evidence of AKI or urinary tract infection.  Patient states that pain is significantly improved with administration of Toradol in the emergency department.  Patient states he has been hesitant to take at home opioid medication and his pain has been refractory to prescribed diclofenac.  Will prescribe oral Toradol and recommend keeping upcoming appointment with urology for reassessment.  Strict return precautions were discussed at length.  Patient overall well-appearing, febrile no acute distress tolerating p.o. without difficulty.  Treatment plan discussed at length with patient and he acknowledged understanding was agreeable to said plan. Worrisome signs and symptoms were discussed with the patient, and the patient acknowledged understanding to return to the ED if noticed. Patient was stable upon discharge.          Final Clinical Impression(s) / ED Diagnoses Final diagnoses:  Right flank pain   Ureterolithiasis    Rx / DC Orders ED Discharge Orders          Ordered    ketorolac (TORADOL) 10 MG tablet  Every 6 hours PRN        05/24/23 1755              Peter Garter, [place] 05/24/23 1815    Alvira Monday, MD 05/25/23 2255

## 2023-05-24 NOTE — Discharge Instructions (Signed)
As discussed, you do have evidence of 3 mm right kidney stone that seems to be moving.  It is 1 cm away from your bladder.  You should be able to pass on its own.  Continue take daily Flomax as well as Toradol for pain.  Keep upcoming appointment with urology for further assessment/evaluation.  If you develop fever, intractable pain, nausea, vomiting, burning when you pee, etc. return to the emergency department.

## 2023-05-24 NOTE — ED Triage Notes (Signed)
Patient here POV from Home.  Endorses Right Flank Pain that began at 0200 today. No Confirmed Fevers. No Dysuria. Some Darker Urine. Some Nausea. No Emesis or Diarrhea.   Known Renal Stone to Right Side from being in ED 3-4 Days ago.

## 2023-06-11 DIAGNOSIS — N201 Calculus of ureter: Secondary | ICD-10-CM | POA: Diagnosis not present

## 2023-06-11 DIAGNOSIS — R1084 Generalized abdominal pain: Secondary | ICD-10-CM | POA: Diagnosis not present

## 2023-06-14 ENCOUNTER — Other Ambulatory Visit: Payer: Self-pay

## 2023-06-14 ENCOUNTER — Encounter (HOSPITAL_BASED_OUTPATIENT_CLINIC_OR_DEPARTMENT_OTHER): Payer: Self-pay | Admitting: Urology

## 2023-06-14 ENCOUNTER — Other Ambulatory Visit: Payer: Self-pay | Admitting: Urology

## 2023-06-14 NOTE — Progress Notes (Signed)
Spoke w/ via phone for pre-op interview---pt Lab needs dos---- none              Lab results------none COVID test -----patient states asymptomatic no test needed Arrive at -------1045 NPO after MN NO Solid Food.  Clear liquids from MN until---0945 Med rec completed Medications to take morning of surgery -----Zofran, and tamsulosin Diabetic medication -----n/a Patient instructed no nail polish to be worn day of surgery Patient instructed to bring photo id and insurance card day of surgery Patient aware to have Driver (ride ) / caregiver parents    for 24 hours after surgery  Patient Special Instructions ----- Pre-Op special Instructions ----- Patient verbalized understanding of instructions that were given at this phone interview. Patient denies shortness of breath, chest pain, fever, cough at this phone interview.   Pt instructed not to vape 24 hours before surgery.

## 2023-06-16 NOTE — H&P (Signed)
CC/HPI: cc: urolithiasis   06/11/23: 22 yo man comes in with first stone episode. He was seen in the ED x 2 with right flank pain. Most recent CT shows 3mm distal right ureteral calculus. No fever, chills or vomiting. He has had nausea. He is here with his mom. UA shows mh. No significant stone burden in kidneys.     ALLERGIES: No Allergies    MEDICATIONS: No Medications    GU PSH: No GU PSH    NON-GU PSH: No Non-GU PSH    GU PMH: No GU PMH    NON-GU PMH: No Non-GU PMH    FAMILY HISTORY: Blood In Urine - Mother Kidney Stones - Mother   SOCIAL HISTORY: Marital Status: Single Preferred Language: English Current Smoking Status: Patient has never smoked.   Tobacco Use Assessment Completed: Used Tobacco in last 30 days? Does not use smokeless tobacco. Does not use drugs. Drinks 1 caffeinated drink per day. Has not had a blood transfusion.    REVIEW OF SYSTEMS:    GU Review Male:   Patient reports frequent urination, burning/ pain with urination, get up at night to urinate, and penile pain. Patient denies hard to postpone urination, leakage of urine, stream starts and stops, trouble starting your stream, have to strain to urinate , and erection problems.  Gastrointestinal (Upper):   Patient denies nausea, vomiting, and indigestion/ heartburn.  Gastrointestinal (Lower):   Patient denies diarrhea and constipation.  Constitutional:   Patient denies fever, night sweats, weight loss, and fatigue.  Skin:   Patient denies skin rash/ lesion and itching.  Eyes:   Patient denies blurred vision and double vision.  Ears/ Nose/ Throat:   Patient denies sore throat and sinus problems.  Hematologic/Lymphatic:   Patient denies swollen glands and easy bruising.  Cardiovascular:   Patient denies leg swelling and chest pains.  Respiratory:   Patient denies shortness of breath and cough.  Endocrine:   Patient denies excessive thirst.  Musculoskeletal:   Patient reports back pain. Patient denies joint  pain.  Neurological:   Patient denies headaches and dizziness.  Psychologic:   Patient denies depression and anxiety.   VITAL SIGNS:      06/11/2023 01:15 PM  BP 118/81 mmHg  Pulse 92 /min  Temperature 98.2 F / 36.7 C   MULTI-SYSTEM PHYSICAL EXAMINATION:    Constitutional: Well-nourished. No physical deformities. Normally developed. Good grooming.  Neck: Neck symmetrical, not swollen. Normal tracheal position.  Respiratory: No labored breathing, no use of accessory muscles.   Skin: No paleness, no jaundice, no cyanosis. No lesion, no ulcer, no rash.  Neurologic / Psychiatric: Oriented to time, oriented to place, oriented to person. No depression, no anxiety, no agitation.  Eyes: Normal conjunctivae. Normal eyelids.  Ears, Nose, Mouth, and Throat: Left ear no scars, no lesions, no masses. Right ear no scars, no lesions, no masses. Nose no scars, no lesions, no masses. Normal hearing. Normal lips.  Musculoskeletal: Normal gait and station of head and neck.     Complexity of Data:  Records Review:   Previous Hospital Records, Previous Patient Records, POC Tool  Urine Test Review:   Urinalysis  X-Ray Review: C.T. Abdomen/Pelvis: Reviewed Films. Reviewed Report. Discussed With Patient. IMPRESSION:  Distal right 3 mm ureteral stone is now further distal in the ureter  now 1 cm proximal to the UVJ proximally. Slight increase in the mild  right renal collecting system dilatation.    Electronically Signed  By: Karen Kays M.D.  On:  05/24/2023 17:42   Notes:                     05/24/23: BUN 13, Cr 10.7   PROCEDURES:          Urinalysis - 81003 Dipstick Dipstick Cont'd Micro  Specimen: Voided Bilirubin: Neg WBC/hpf: NS (Not Seen)  Color: Yellow Ketones: Neg RBC/hpf: 10 - 20/hpf  Appearance: Clear Blood: Trace Lysed Bacteria: NS (Not Seen)  Specific Gravity: 1.025 Protein: Neg Cystals: Amorph Urates  pH: 5.5 Urobilinogen: 0.2 Casts: NS (Not Seen)  Glucose: Neg Nitrites: Neg  Trichomonas: Not Present    Leukocyte Esterase: Neg Mucous: Present      Epithelial Cells: NS (Not Seen)      Yeast: NS (Not Seen)      Sperm: Not Present         Urinalysis w/Scope Dipstick Dipstick Cont'd Micro  Color: Yellow Bilirubin: Neg mg/dL WBC/hpf: NS (Not Seen)  Appearance: Clear Ketones: Trace mg/dL RBC/hpf: 10 - 57/QIO  Specific Gravity: 1.025 Blood: 2+ ery/uL Bacteria: NS (Not Seen)  pH: 5.5 Protein: Neg mg/dL Cystals: Amorph Urates  Glucose: Neg mg/dL Urobilinogen: 0.2 mg/dL Casts: NS (Not Seen)    Nitrites: Neg Trichomonas: Not Present    Leukocyte Esterase: Neg leu/uL Mucous: Present      Epithelial Cells: NS (Not Seen)      Yeast: NS (Not Seen)      Sperm: Not Present    ASSESSMENT:      ICD-10 Details  1 GU:   Ureteral calculus - N20.1 Acute, Stable  2   Flank Pain - R10.84 Acute, Stable   PLAN:           Document Letter(s):  Created for Patient: Clinical Summary         Notes:   Urolithiasis:  -discussed stone management options including MET, ESWL and ureteroscopy  -based on size and location, ESWL is not always first choice  -pt remains on flomax and toradol. he does have hydrocodone at home.  -he would like to proceed with ureteroscopy/LL/stent  -risks and benefits discussed with the patient

## 2023-06-17 ENCOUNTER — Ambulatory Visit (HOSPITAL_BASED_OUTPATIENT_CLINIC_OR_DEPARTMENT_OTHER): Admission: RE | Admit: 2023-06-17 | Payer: BC Managed Care – PPO | Source: Ambulatory Visit | Admitting: Urology

## 2023-06-17 ENCOUNTER — Encounter (HOSPITAL_COMMUNITY): Payer: Self-pay | Admitting: Anesthesiology

## 2023-06-17 DIAGNOSIS — Z419 Encounter for procedure for purposes other than remedying health state, unspecified: Secondary | ICD-10-CM

## 2023-06-17 DIAGNOSIS — N202 Calculus of kidney with calculus of ureter: Secondary | ICD-10-CM | POA: Diagnosis not present

## 2023-06-17 HISTORY — DX: Personal history of urinary calculi: Z87.442

## 2023-06-17 HISTORY — DX: Attention-deficit hyperactivity disorder, unspecified type: F90.9

## 2023-06-17 SURGERY — CYSTOSCOPY/URETEROSCOPY/HOLMIUM LASER/STENT PLACEMENT
Anesthesia: General | Laterality: Right

## 2023-10-11 DIAGNOSIS — R1084 Generalized abdominal pain: Secondary | ICD-10-CM | POA: Diagnosis not present

## 2024-07-26 DIAGNOSIS — R6883 Chills (without fever): Secondary | ICD-10-CM | POA: Diagnosis not present

## 2024-07-26 DIAGNOSIS — J029 Acute pharyngitis, unspecified: Secondary | ICD-10-CM | POA: Diagnosis not present
